# Patient Record
Sex: Male | Born: 1950 | Race: White | Hispanic: No | Marital: Married | Smoking: Former smoker
Health system: Southern US, Community
[De-identification: ages and names within clinical notes are randomized; demographics above are authoritative.]

## PROBLEM LIST (undated history)

## (undated) DIAGNOSIS — N200 Calculus of kidney: Secondary | ICD-10-CM

## (undated) DIAGNOSIS — E7800 Pure hypercholesterolemia, unspecified: Secondary | ICD-10-CM

## (undated) DIAGNOSIS — R042 Hemoptysis: Secondary | ICD-10-CM

## (undated) DIAGNOSIS — M1990 Unspecified osteoarthritis, unspecified site: Secondary | ICD-10-CM

## (undated) DIAGNOSIS — C801 Malignant (primary) neoplasm, unspecified: Secondary | ICD-10-CM

## (undated) DIAGNOSIS — D126 Benign neoplasm of colon, unspecified: Secondary | ICD-10-CM

## (undated) DIAGNOSIS — E785 Hyperlipidemia, unspecified: Secondary | ICD-10-CM

## (undated) DIAGNOSIS — G4730 Sleep apnea, unspecified: Secondary | ICD-10-CM

## (undated) HISTORY — PX: COLONOSCOPY: SHX174

---

## 2004-04-29 ENCOUNTER — Ambulatory Visit: Payer: Self-pay | Admitting: Otolaryngology

## 2004-08-31 ENCOUNTER — Ambulatory Visit: Payer: Self-pay | Admitting: Otolaryngology

## 2006-10-02 ENCOUNTER — Ambulatory Visit: Payer: Self-pay | Admitting: Gastroenterology

## 2007-06-05 ENCOUNTER — Ambulatory Visit: Payer: Self-pay | Admitting: Internal Medicine

## 2007-12-03 ENCOUNTER — Ambulatory Visit: Payer: Self-pay | Admitting: Internal Medicine

## 2008-02-15 ENCOUNTER — Emergency Department: Payer: Self-pay | Admitting: Emergency Medicine

## 2008-02-16 ENCOUNTER — Emergency Department: Payer: Self-pay | Admitting: Internal Medicine

## 2008-02-20 ENCOUNTER — Ambulatory Visit: Payer: Self-pay | Admitting: Urology

## 2008-02-29 ENCOUNTER — Ambulatory Visit: Payer: Self-pay | Admitting: Urology

## 2008-03-06 ENCOUNTER — Ambulatory Visit: Payer: Self-pay | Admitting: Urology

## 2008-03-20 ENCOUNTER — Ambulatory Visit: Payer: Self-pay | Admitting: Urology

## 2008-06-18 ENCOUNTER — Ambulatory Visit: Payer: Self-pay | Admitting: Urology

## 2008-12-31 ENCOUNTER — Ambulatory Visit: Payer: Self-pay | Admitting: Urology

## 2009-10-15 IMAGING — CR DG ABDOMEN 1V
1 series · 2 of 2 positions shown · non-contrast
Comparison: none

REASON FOR EXAM: nephrolithiasis
COMMENTS:

[Series 1: view not recorded · 0.17mm/px · 2 of 2 slices shown]
[im 1/2]
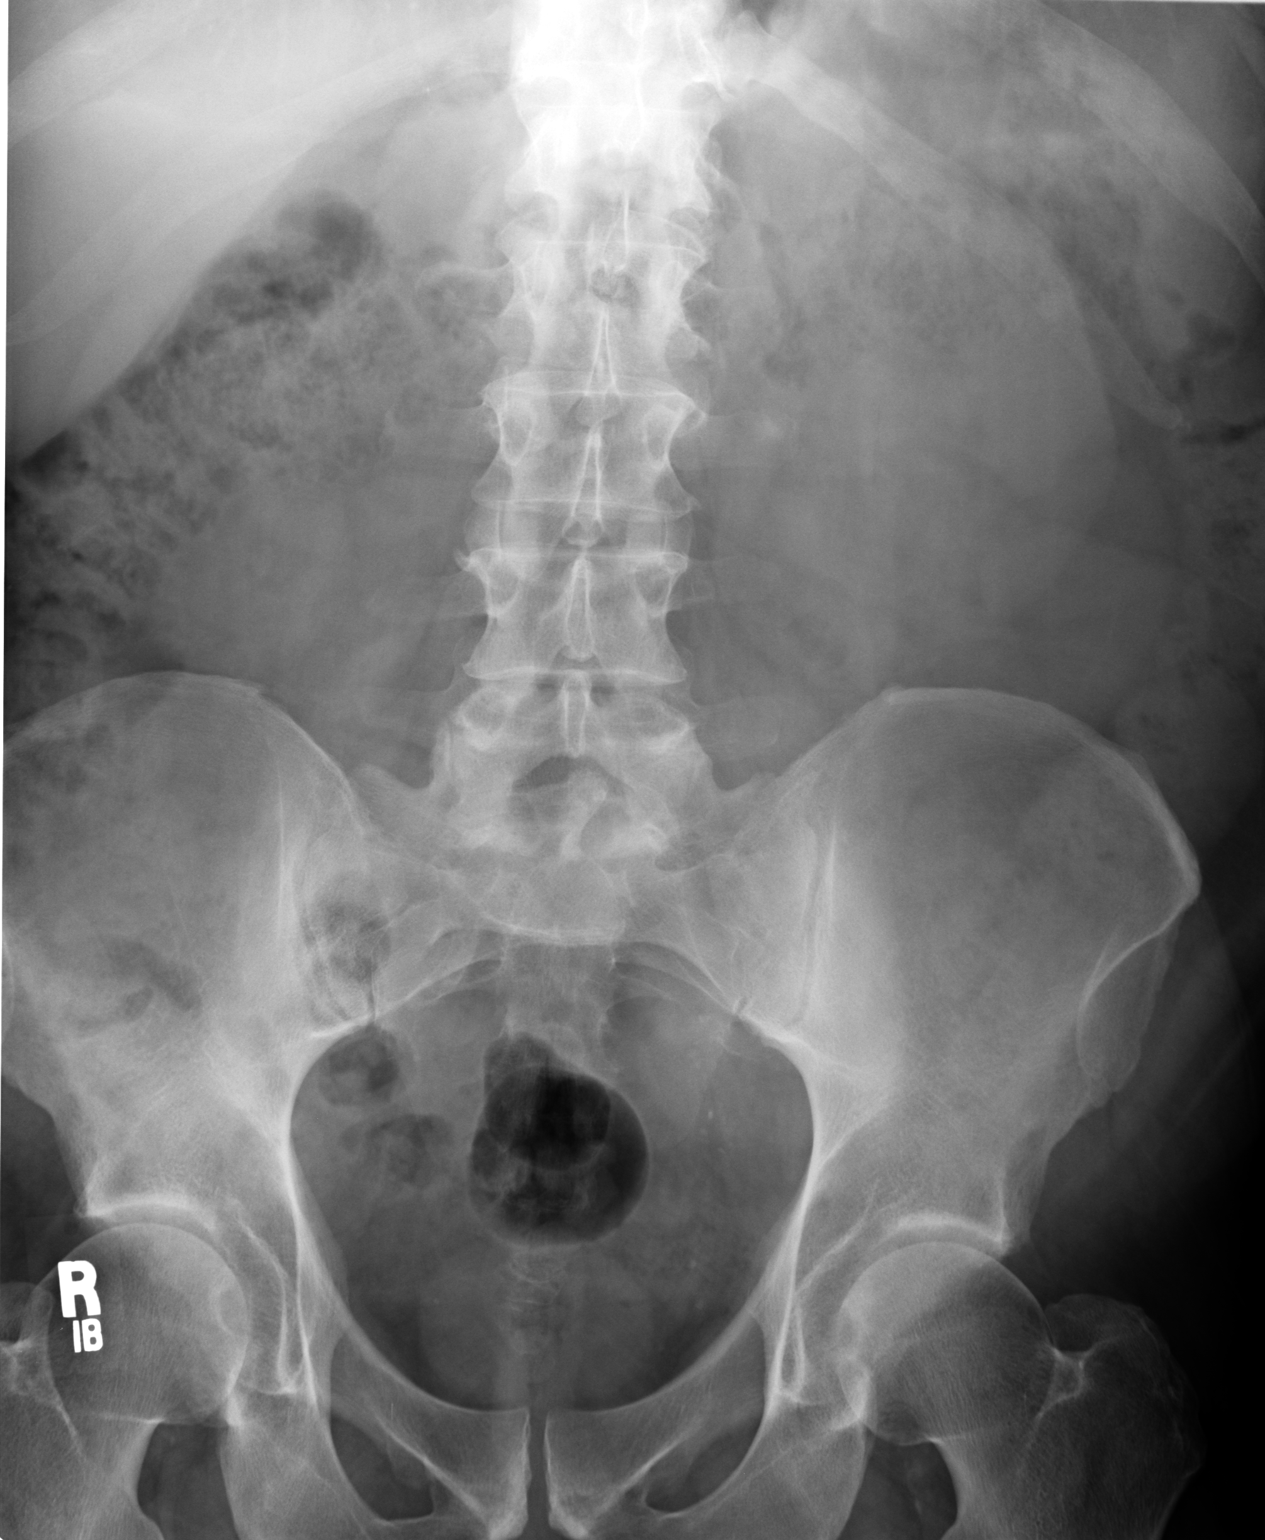
[im 2/2]
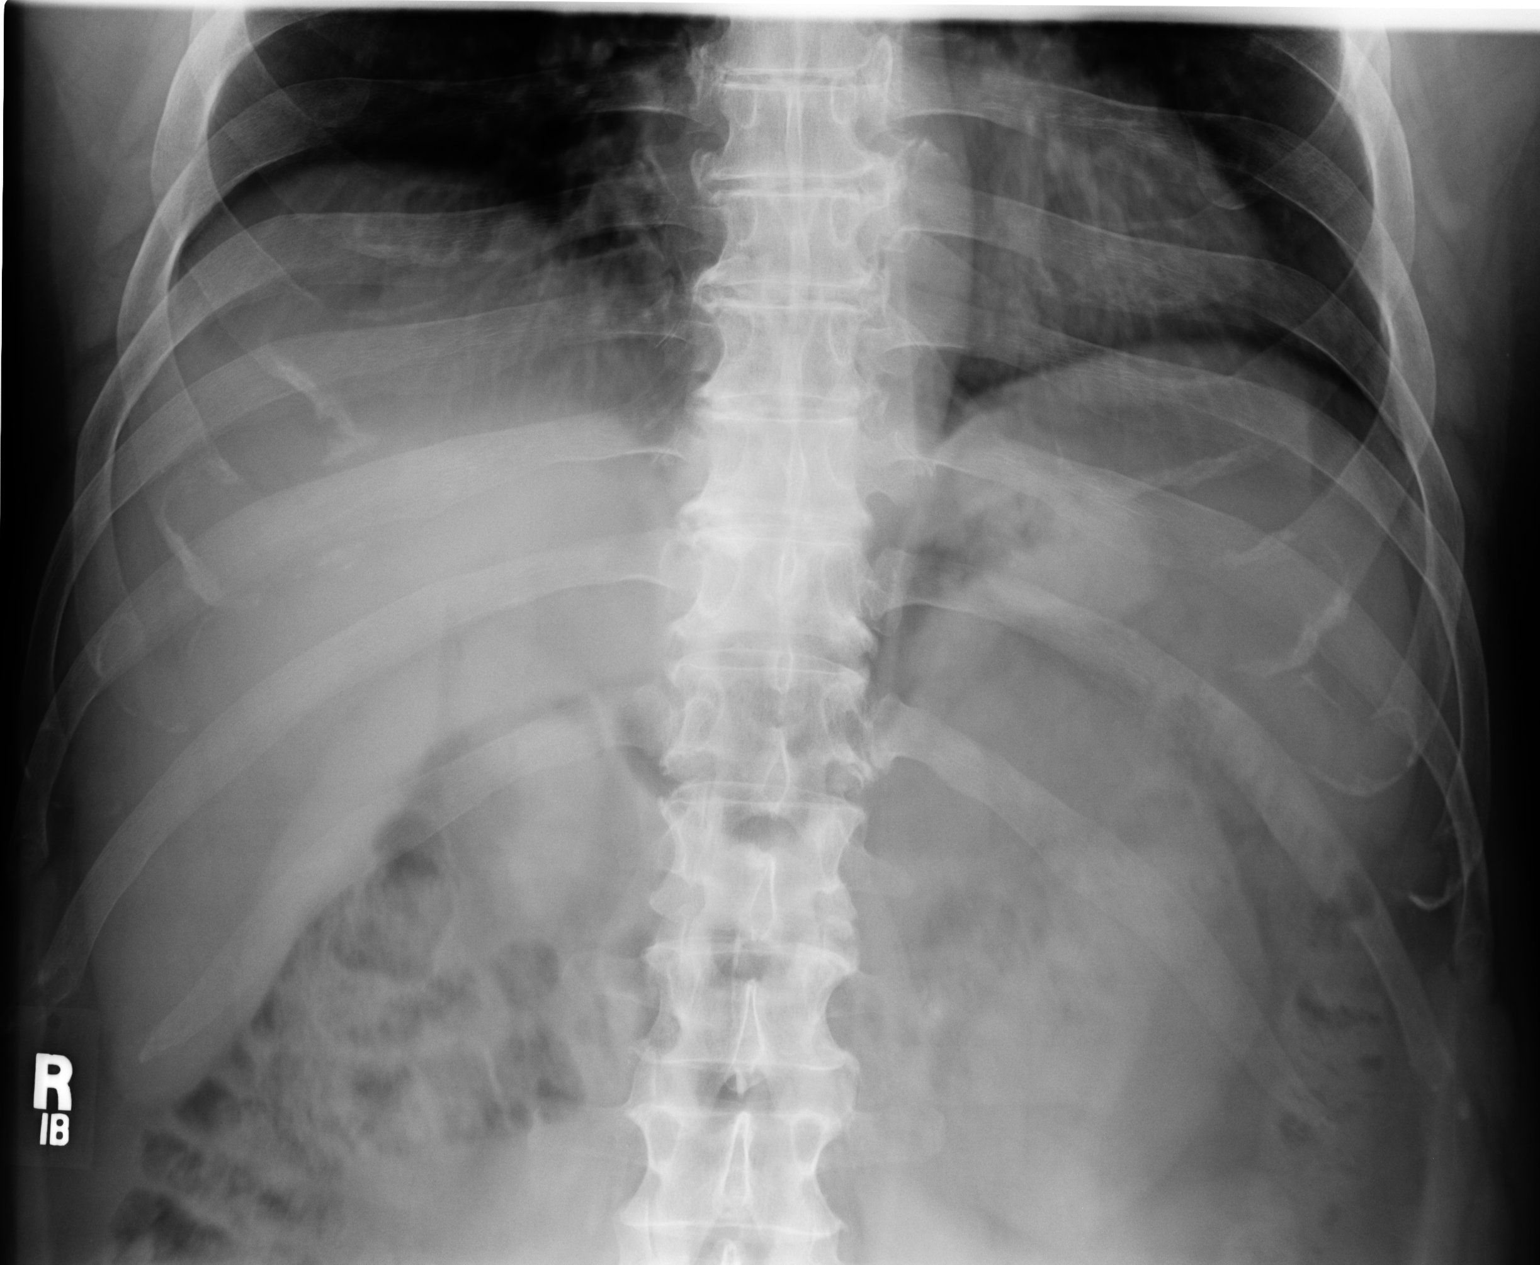

[2 of 2 positions shown; findings below may reference images not displayed]

PROCEDURE:     DXR - DXR KIDNEY URETER BLADDER  - February 20, 2008  [DATE]

RESULT:     There is a faint 7 mm density projected over the lateral aspect
of the third lumbar transverse process on the LEFT. This likely represents
the renal stone projected at the level of the LEFT renal pelvis on the prior
exam of 12/03/2007. No other densities suspicious for renal or ureteral
calcifications are identified.
IMPRESSION: There is a faint density noted along the medial aspect of the LEFT kidney
which also overlies the L3 lumbar transverse process. This likely represents
the LEFT renal stone noted at prior CT.

## 2009-10-24 IMAGING — CR DG ABDOMEN 1V
1 series · 1 of 1 positions shown · non-contrast
Comparison: none

REASON FOR EXAM: NEPHROLITHIASIS
COMMENTS:

[view not recorded]
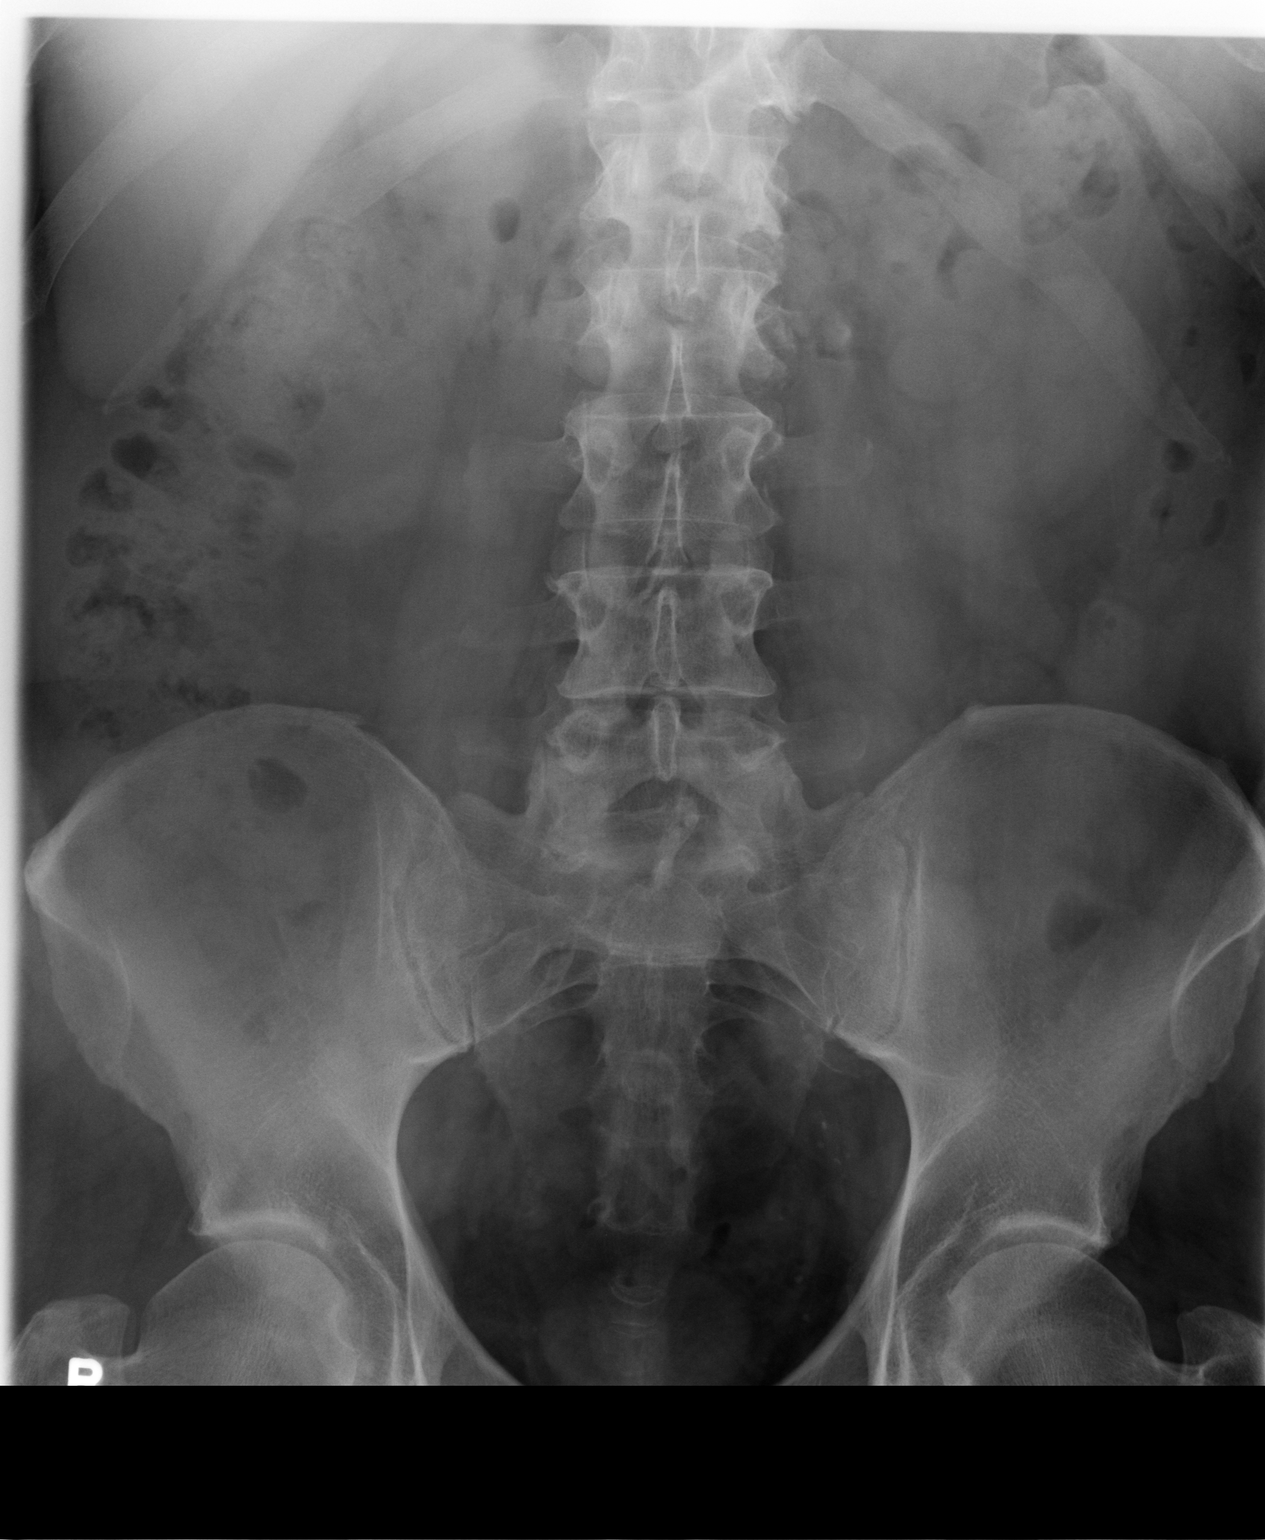

[1 of 1 positions shown; findings below may reference images not displayed]

PROCEDURE:     DXR - DXR KIDNEY URETER BLADDER  - February 29, 2008  [DATE]

RESULT:     Comparison is made to a prior exam of 02/20/2008.

There is a nonspecific, ill-defined density projected over the L2 lumbar
transverse process which could possibly represent a stone in the proximal
LEFT ureter that is visualized higher in position than on the prior exam due
to differences in inspiration and projection. The finding; however, is not
definite on plain film examination. No other densities suspicious for renal
or ureteral stones are identified.
IMPRESSION: Possible proximal LEFT ureteral stone.

## 2010-01-06 ENCOUNTER — Ambulatory Visit: Payer: Self-pay | Admitting: Urology

## 2010-01-07 ENCOUNTER — Ambulatory Visit: Payer: Self-pay | Admitting: Gastroenterology

## 2010-02-11 IMAGING — CR DG ABDOMEN 1V
1 series · 2 of 2 positions shown · non-contrast
Comparison: none

REASON FOR EXAM: NEPHROLITHIASIS
COMMENTS:

[Series 1: view not recorded · 0.17mm/px · 2 of 2 slices shown]
[im 1/2]
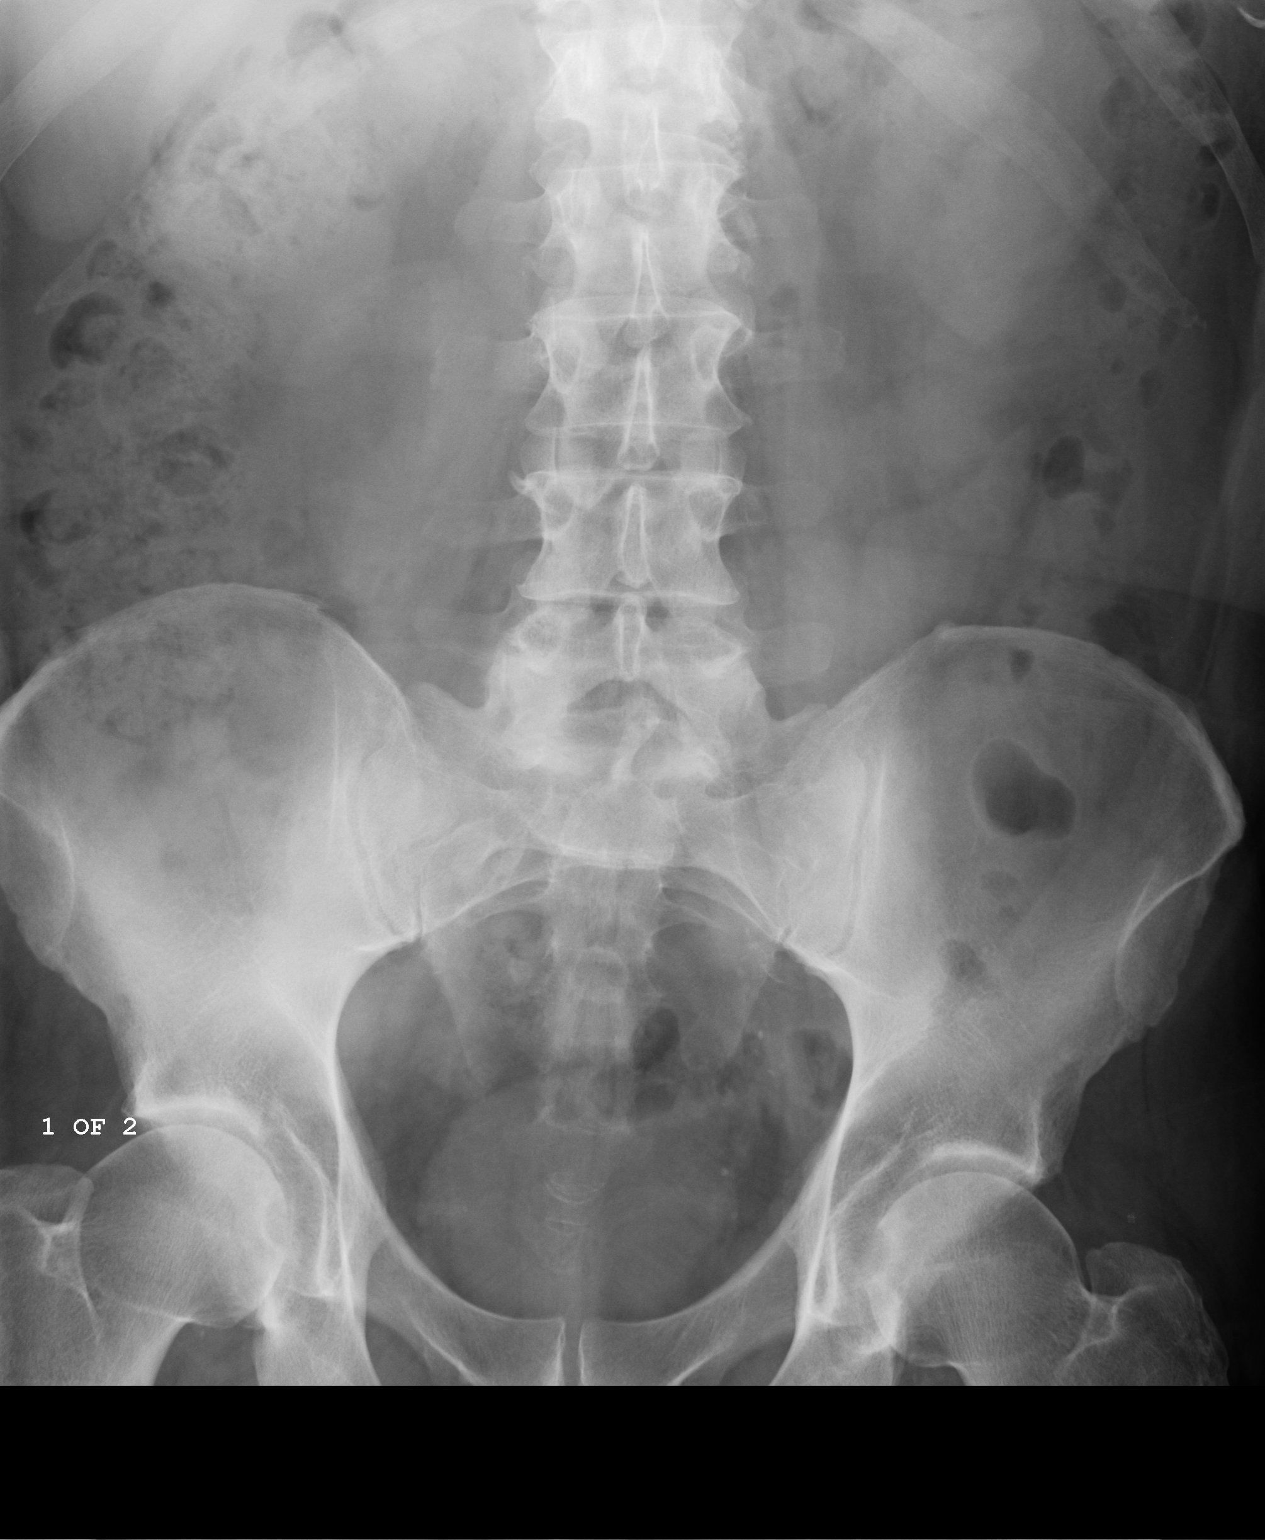
[im 2/2]
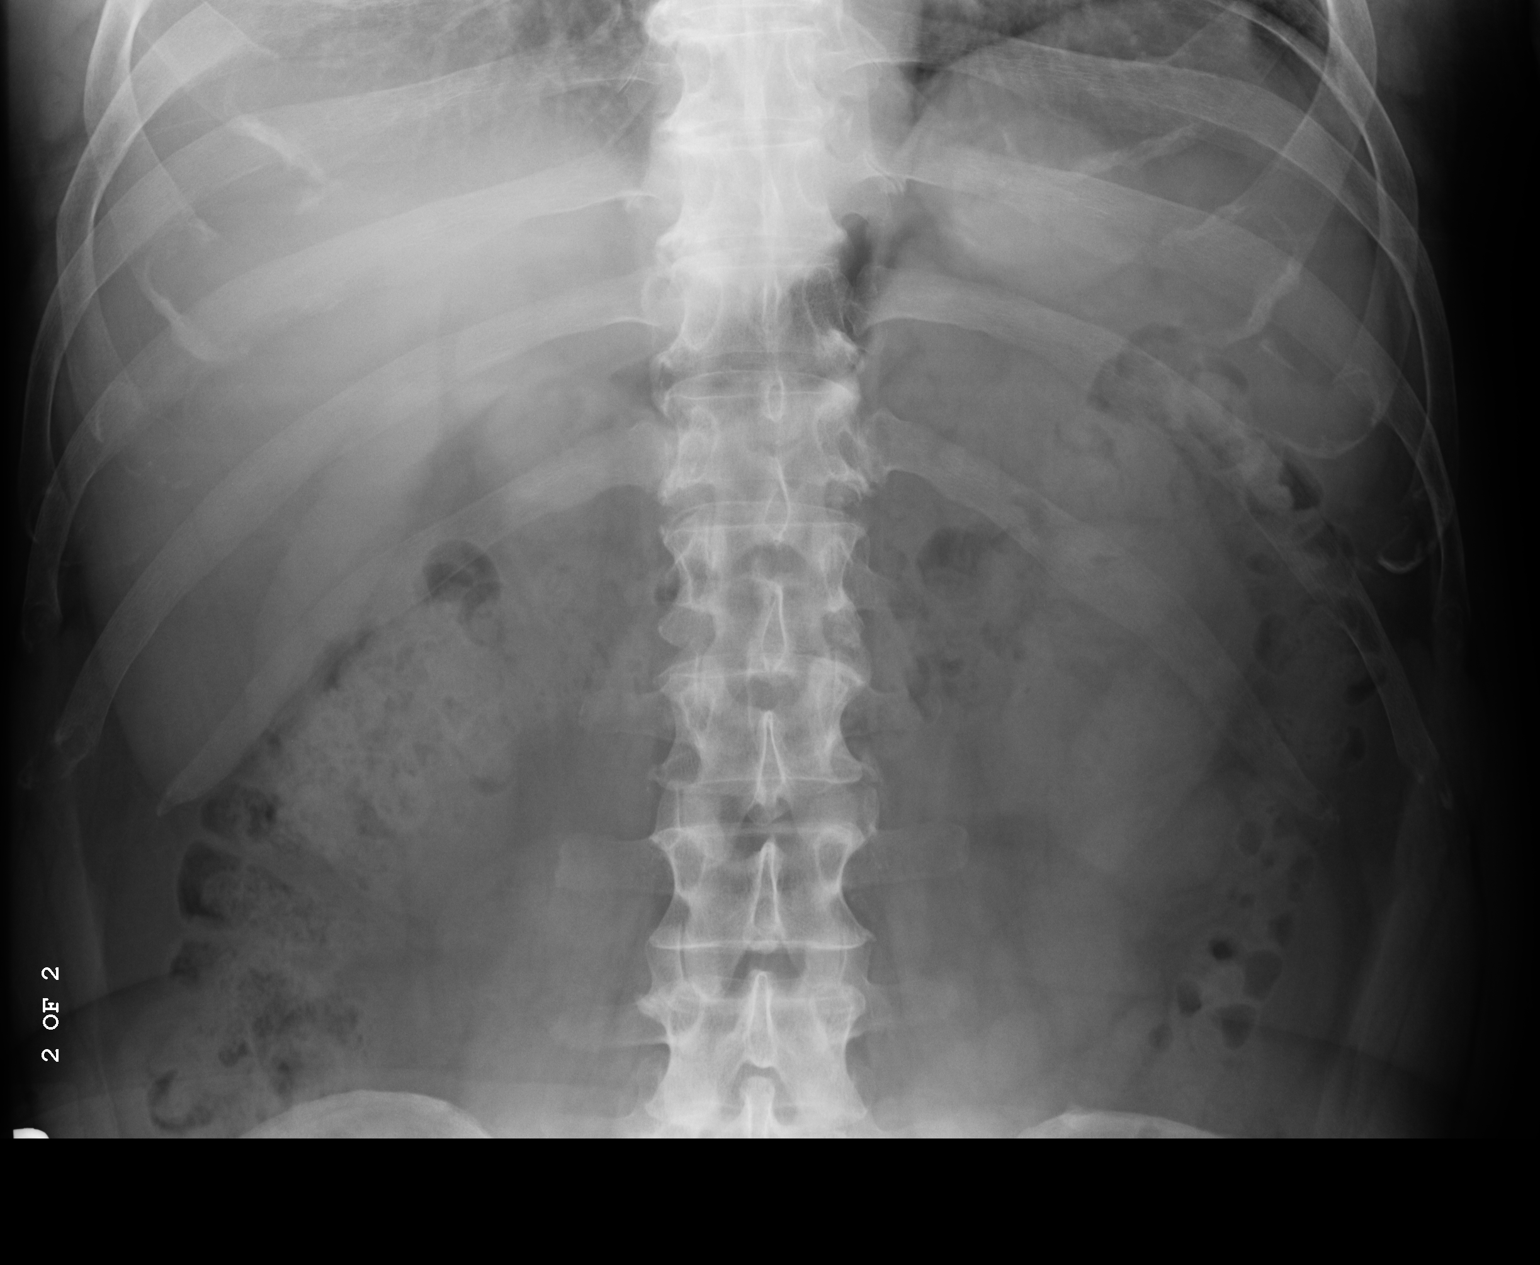

[2 of 2 positions shown; findings below may reference images not displayed]

PROCEDURE:     DXR - DXR KIDNEY URETER BLADDER  - June 18, 2008  [DATE]

RESULT:     Comparison is made to the previous image of 03/20/2008.

Densities are again noted in the left pelvic region consistent with
phleboliths. A distal ureteral stone would be difficult to completely
exclude on the left. No definite renal stones are evident. The small stone
seen previously over the left kidney is not definitely identified. This may
have passed.
IMPRESSION: No definite urinary tract stones evident. Please see above.

## 2011-01-05 ENCOUNTER — Ambulatory Visit: Payer: Self-pay | Admitting: Urology

## 2012-01-09 ENCOUNTER — Ambulatory Visit: Payer: Self-pay | Admitting: Urology

## 2012-08-30 IMAGING — CR DG ABDOMEN 1V
1 series · 1 of 1 positions shown · non-contrast
Comparison: none

REASON FOR EXAM: NEPHROLITHIASIS PT NEED FILMS
COMMENTS:

[view not recorded]
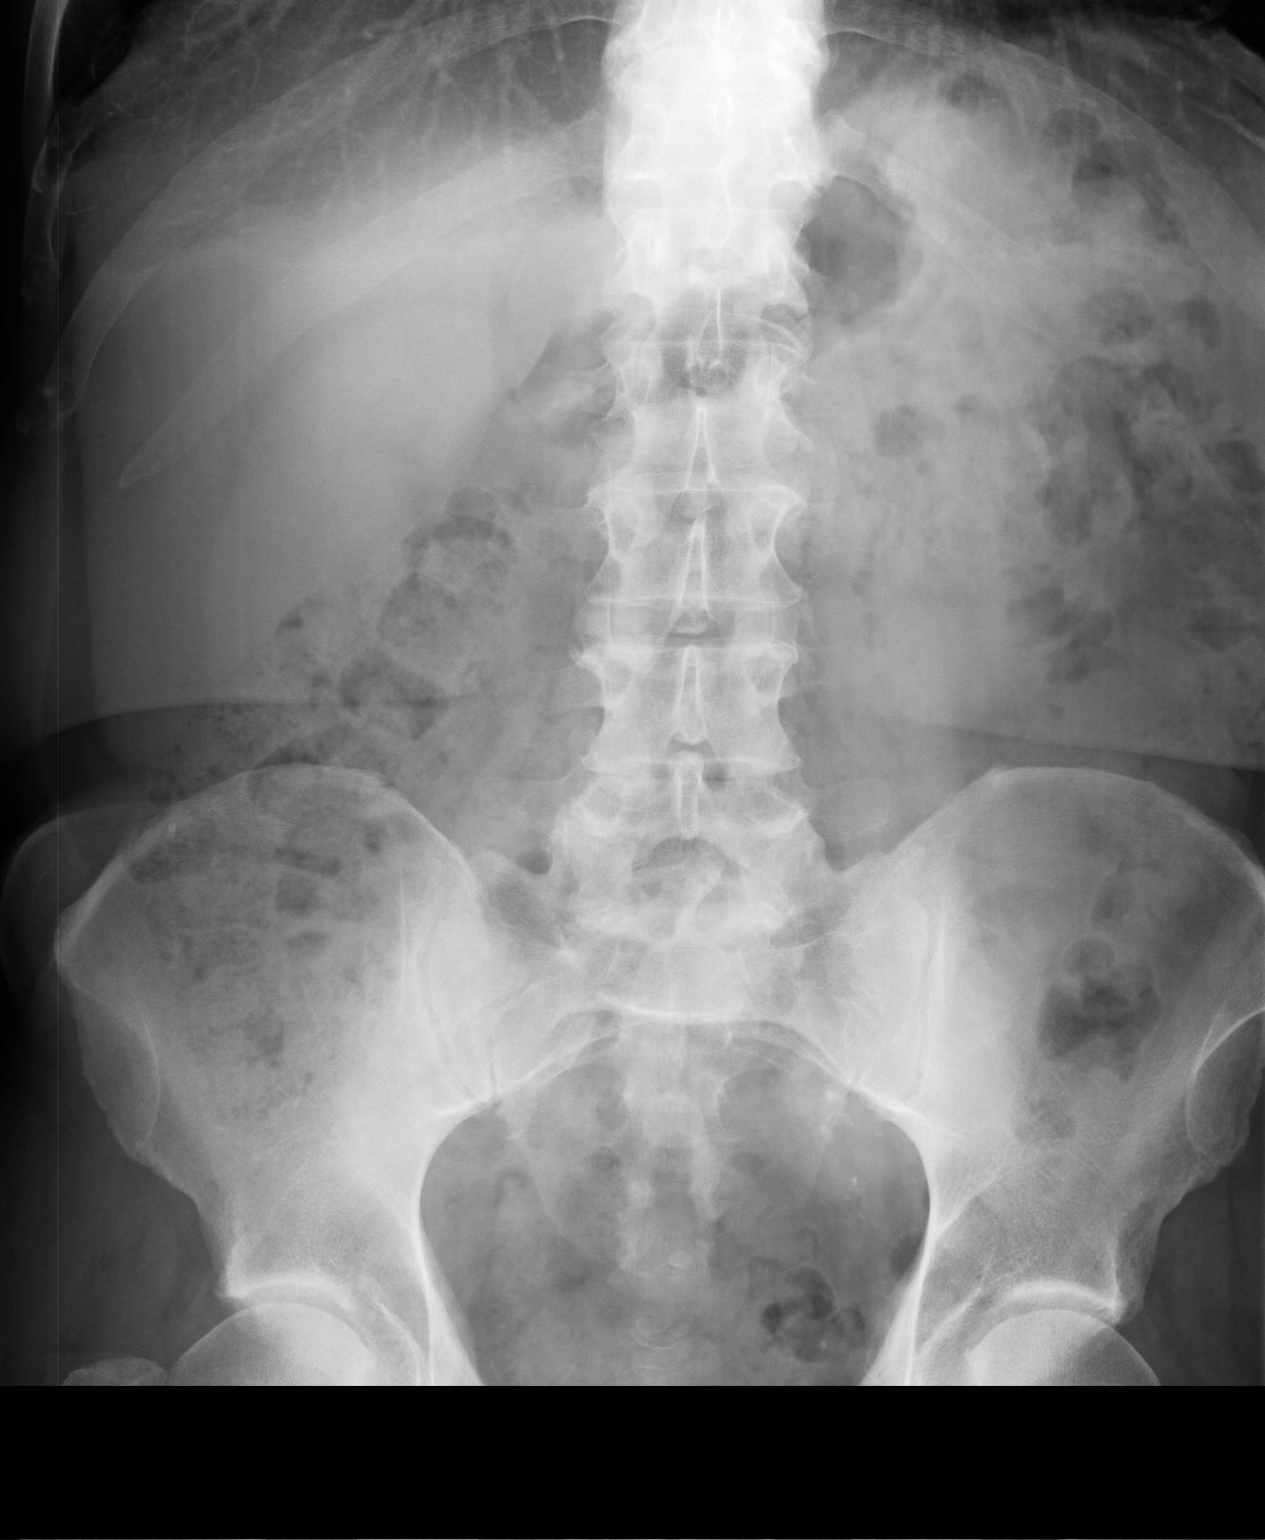

[1 of 1 positions shown; findings below may reference images not displayed]

PROCEDURE:     DXR - DXR KIDNEY URETER BLADDER  - January 05, 2011 [DATE]

RESULT:     Comparison is made to the prior exam of 01/06/2010.

No definite renal or ureteral calcifications are identified by routine
radiography. There is a large amount of fecal material in the colon. The
osseous structures are normal in appearance.
IMPRESSION: No definite renal or ureteral calcifications are identified by
routine radiography.

## 2013-01-09 ENCOUNTER — Ambulatory Visit: Payer: Self-pay | Admitting: Urology

## 2014-08-12 HISTORY — PX: PROSTATECTOMY: SHX69

## 2014-09-04 IMAGING — CR DG ABDOMEN 1V
1 series · 1 of 1 positions shown · non-contrast
Comparison: none

REASON FOR EXAM: Nephrolithiasis renal colic
COMMENTS:

[t abdomen supine]
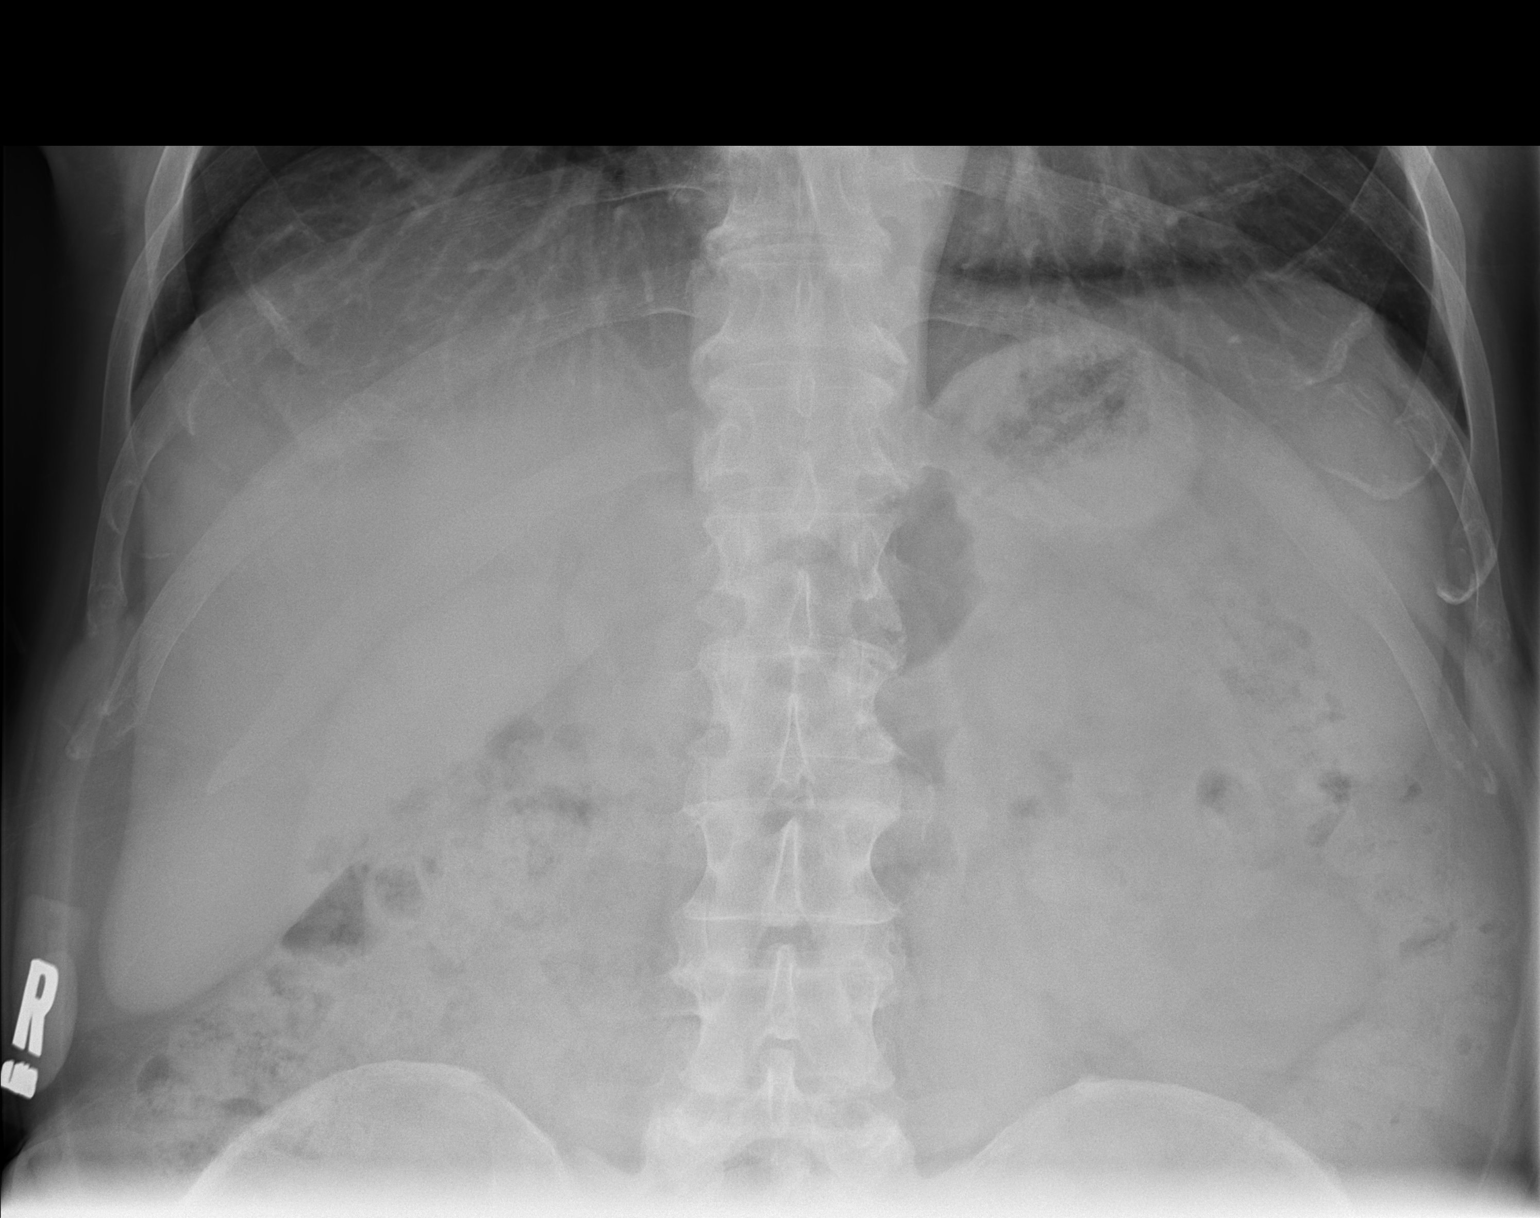

[1 of 1 positions shown; findings below may reference images not displayed]

PROCEDURE:     DXR - DXR KIDNEY URETER BLADDER  - January 09, 2013  [DATE]

RESULT:     Comparison is made to previous images of 01/09/2012. No definite
radiopaque calculi are seen. The kidneys are partially obscured by overlying
fecal material. There is calcification in the pelvis especially on the left
which could represent a distal left ureteral stone or phlebolith. This is
unchanged. Bony structures are unremarkable.
IMPRESSION: 1. No definite nephrolithiasis evident. No evidence of bowel obstruction.

[REDACTED]

## 2014-12-31 ENCOUNTER — Encounter: Payer: Self-pay | Admitting: *Deleted

## 2015-01-01 ENCOUNTER — Ambulatory Visit: Payer: BLUE CROSS/BLUE SHIELD | Admitting: Anesthesiology

## 2015-01-01 ENCOUNTER — Ambulatory Visit
Admission: RE | Admit: 2015-01-01 | Discharge: 2015-01-01 | Disposition: A | Discharge status: Home / Self Care | Payer: BLUE CROSS/BLUE SHIELD | Source: Ambulatory Visit | Attending: Gastroenterology | Admitting: Gastroenterology

## 2015-01-01 ENCOUNTER — Encounter
Admission: RE | Disposition: A | Discharge status: Home / Self Care | Payer: Self-pay | Source: Ambulatory Visit | Attending: Gastroenterology

## 2015-01-01 DIAGNOSIS — D122 Benign neoplasm of ascending colon: Secondary | ICD-10-CM | POA: Insufficient documentation

## 2015-01-01 DIAGNOSIS — Z86010 Personal history of colonic polyps: Secondary | ICD-10-CM | POA: Insufficient documentation

## 2015-01-01 DIAGNOSIS — E780 Pure hypercholesterolemia: Secondary | ICD-10-CM | POA: Insufficient documentation

## 2015-01-01 DIAGNOSIS — D125 Benign neoplasm of sigmoid colon: Secondary | ICD-10-CM | POA: Insufficient documentation

## 2015-01-01 DIAGNOSIS — G4730 Sleep apnea, unspecified: Secondary | ICD-10-CM | POA: Insufficient documentation

## 2015-01-01 DIAGNOSIS — Z87891 Personal history of nicotine dependence: Secondary | ICD-10-CM | POA: Diagnosis not present

## 2015-01-01 DIAGNOSIS — Z87442 Personal history of urinary calculi: Secondary | ICD-10-CM | POA: Insufficient documentation

## 2015-01-01 DIAGNOSIS — Z8371 Family history of colonic polyps: Secondary | ICD-10-CM | POA: Insufficient documentation

## 2015-01-01 HISTORY — PX: COLONOSCOPY WITH PROPOFOL: SHX5780

## 2015-01-01 HISTORY — DX: Pure hypercholesterolemia, unspecified: E78.00

## 2015-01-01 HISTORY — DX: Hemoptysis: R04.2

## 2015-01-01 HISTORY — DX: Sleep apnea, unspecified: G47.30

## 2015-01-01 HISTORY — DX: Calculus of kidney: N20.0

## 2015-01-01 SURGERY — COLONOSCOPY WITH PROPOFOL
Anesthesia: General

## 2015-01-01 MED ORDER — LIDOCAINE HCL (CARDIAC) 20 MG/ML IV SOLN
INTRAVENOUS | Status: DC | PRN
  Administered 2015-01-01: 30 mg via INTRAVENOUS

## 2015-01-01 MED ORDER — SODIUM CHLORIDE 0.9 % IV SOLN
INTRAVENOUS | Status: DC
  Administered 2015-01-01: 1000 mL via INTRAVENOUS

## 2015-01-01 MED ORDER — PROPOFOL INFUSION 10 MG/ML OPTIME
INTRAVENOUS | Status: DC | PRN
  Administered 2015-01-01: 160 ug/kg/min via INTRAVENOUS

## 2015-01-01 MED ORDER — FENTANYL CITRATE (PF) 100 MCG/2ML IJ SOLN
INTRAMUSCULAR | Status: DC | PRN
  Administered 2015-01-01: 50 ug via INTRAVENOUS

## 2015-01-01 MED ORDER — PROPOFOL 10 MG/ML IV BOLUS
INTRAVENOUS | Status: DC | PRN
  Administered 2015-01-01: 50 mg via INTRAVENOUS

## 2015-01-01 MED ORDER — MIDAZOLAM HCL 5 MG/5ML IJ SOLN
INTRAMUSCULAR | Status: DC | PRN
  Administered 2015-01-01: 1 mg via INTRAVENOUS

## 2015-01-01 MED ORDER — SODIUM CHLORIDE 0.9 % IV SOLN
INTRAVENOUS | Status: DC
  Administered 2015-01-01 (×2): via INTRAVENOUS

## 2015-01-01 NOTE — Op Note (Signed)
[facility] Gastroenterology Patient Name: William Larsen Procedure Date: 01/01/2015 8:35 AM MRN: [id number] Account #: [id number] Date of Birth: [birth year] Admit Type: Outpatient Age: 64 Room: [facility] ENDO ROOM 4 Gender: Male Note Status: Finalized Procedure:         Colonoscopy Indications:       Family history of colonic polyps in a first-degree                     relative, Personal history of colonic polyps Providers:         Lupita Dawn. Candace Cruise, MD Referring MD:      Ocie Cornfield. Ouida Sills, MD (Referring MD) Medicines:         Monitored Anesthesia Care Complications:     No immediate complications. Procedure:         Pre-Anesthesia Assessment:                    - Prior to the procedure, a History and Physical was                     performed, and patient medications, allergies and                     sensitivities were reviewed. The patient's tolerance of                     previous anesthesia was reviewed.                    - The risks and benefits of the procedure and the sedation                     options and risks were discussed with the patient. All                     questions were answered and informed consent was obtained.                    - After reviewing the risks and benefits, the patient was                     deemed in satisfactory condition to undergo the procedure.                    After obtaining informed consent, the colonoscope was                     passed under direct vision. Throughout the procedure, the                     patient's blood pressure, pulse, and oxygen saturations                     were monitored continuously. The Olympus PCF-160AL                     Colonoscope (S# [id number]) was introduced through the anus                     and advanced to the the cecum, identified by appendiceal                     orifice and ileocecal valve. The colonoscopy was performed  without difficulty. The patient tolerated  the procedure                     well. The quality of the bowel preparation was good. Findings:      A small polyp was found in the ascending colon. The polyp was sessile.       The polyp was removed with a cold snare. Resection and retrieval were       complete.      A diminutive polyp was found in the sigmoid colon. The polyp was       sessile. The polyp was removed with a jumbo cold forceps. Resection and       retrieval were complete.      The exam was otherwise without abnormality. Impression:        - One small polyp in the ascending colon. Resected and                     retrieved.                    - One diminutive polyp in the sigmoid colon. Resected and                     retrieved.                    - The examination was otherwise normal. Recommendation:    - Discharge patient to home.                    - Await pathology results.                    - Repeat colonoscopy in 5 years for surveillance.                    - The findings and recommendations were discussed with the                     patient. Procedure Code(s): --- Professional ---                    [phone], Colonoscopy, flexible; with removal of tumor(s),                     polyp(s), or other lesion(s) by snare technique                    [id number], 59, Colonoscopy, flexible; with biopsy, single or                     multiple Diagnosis Code(s): --- Professional ---                    D12.2, Benign neoplasm of ascending colon                    D12.5, Benign neoplasm of sigmoid colon                    Z83.71, Family history of colonic polyps                    Z86.010, Personal history of colonic polyps CPT copyright 2014 [employer]. All rights reserved. The codes documented in this report are preliminary and upon coder review may  be revised to meet current compliance requirements. Eddie Dibbles  Johnell Comings, MD 01/01/2015 9:01:44 AM This report has been signed electronically. Number of Addenda: 0 Note  Initiated On: 01/01/2015 8:35 AM Scope Withdrawal Time: 0 hours 6 minutes 27 seconds  Total Procedure Duration: 0 hours 9 minutes 4 seconds       [facility]

## 2015-01-01 NOTE — H&P (Signed)
    Primary Care Physician:  Kirk Ruths., MD Primary Gastroenterologist:  Dr. Candace Cruise  Pre-Procedure History & Physical: HPI:  William Larsen. is a 64 y.o. male is here for an colonoscopy.   Past Medical History  Diagnosis Date  . Sleep apnea   . Hypercholesterolemia   . Kidney stones   . Hemoptysis     Past Surgical History  Procedure Laterality Date  . Colonoscopy      Prior to Admission medications   Medication Sig Start Date End Date Taking? Authorizing Provider  simvastatin (ZOCOR) 40 MG tablet Take 40 mg by mouth daily.   Yes Historical Provider, MD    Allergies as of 12/09/2014  . (Not on File)    History reviewed. No pertinent family history.  History   Social History  . Marital Status: Married    Spouse Name: N/A  . Number of Children: N/A  . Years of Education: N/A   Occupational History  . Not on file.   Social History Main Topics  . Smoking status: Former Research scientist (life sciences)  . Smokeless tobacco: Not on file  . Alcohol Use: No  . Drug Use: Not on file  . Sexual Activity: Not on file   Other Topics Concern  . Not on file   Social History Narrative    Review of Systems: See HPI, otherwise negative ROS  Physical Exam: BP 121/81 mmHg  Pulse 80  Temp(Src) 97.6 F (36.4 C) (Tympanic)  Resp 18  Ht 5\' 9"  (1.753 m)  Wt 97.523 kg (215 lb)  BMI 31.74 kg/m2  SpO2 96% General:   Alert,  pleasant and cooperative in NAD Head:  Normocephalic and atraumatic. Neck:  Supple; no masses or thyromegaly. Lungs:  Clear throughout to auscultation.    Heart:  Regular rate and rhythm. Abdomen:  Soft, nontender and nondistended. Normal bowel sounds, without guarding, and without rebound.   Neurologic:  Alert and  oriented x4;  grossly normal neurologically.  Impression/Plan: William Pat. is here for an colonoscopy to be performed for personal hx and family hx of colon polyps.  Risks, benefits, limitations, and alternatives regarding colonoscopy have  been reviewed with the patient.  Questions have been answered.  All parties agreeable.   William Larsen, Lupita Dawn, MD  01/01/2015, 8:36 AM

## 2015-01-01 NOTE — Transfer of Care (Signed)
Immediate Anesthesia Transfer of Care Note  Patient: William Larsen.  Procedure(s) Performed: Procedure(s): COLONOSCOPY WITH PROPOFOL (N/A)  Patient Location: PACU and Short Stay  Anesthesia Type:General  Level of Consciousness: awake, alert , oriented and patient cooperative  Airway & Oxygen Therapy: Patient Spontanous Breathing and Patient connected to nasal cannula oxygen  Post-op Assessment: Report given to RN  Post vital signs: Reviewed and stable  Last Vitals:  Filed Vitals:   01/01/15 0905  BP:   Pulse: 73  Temp: 35.9 C  Resp: 14    Complications: No apparent anesthesia complications

## 2015-01-01 NOTE — Anesthesia Postprocedure Evaluation (Signed)
  Anesthesia Post-op Note  Patient: William Larsen.  Procedure(s) Performed: Procedure(s): COLONOSCOPY WITH PROPOFOL (N/A)  Anesthesia type:General  Patient location: PACU  Post pain: Pain level controlled  Post assessment: Post-op Vital signs reviewed, Patient's Cardiovascular Status Stable, Respiratory Function Stable, Patent Airway and No signs of Nausea or vomiting  Post vital signs: Reviewed and stable  Last Vitals:  Filed Vitals:   01/01/15 0905  BP: 93/77  Pulse: 73  Temp: 35.9 C  Resp: 14    Level of consciousness: awake, alert  and patient cooperative  Complications: No apparent anesthesia complications

## 2015-01-01 NOTE — Anesthesia Preprocedure Evaluation (Signed)
Anesthesia Evaluation  Patient identified by MRN, date of birth, ID band Patient awake    Reviewed: Allergy & Precautions, NPO status , Patient's Chart, lab work & pertinent test results  History of Anesthesia Complications Negative for: history of anesthetic complications  Airway Mallampati: III       Dental  (+) Teeth Intact, Chipped   Pulmonary sleep apnea and Continuous Positive Airway Pressure Ventilation , former smoker,    + decreased breath sounds      Cardiovascular negative cardio ROS Normal cardiovascular exam    Neuro/Psych    GI/Hepatic Neg liver ROS,   Endo/Other  negative endocrine ROS  Renal/GU Renal diseasenegative Renal ROS     Musculoskeletal negative musculoskeletal ROS (+)   Abdominal Normal abdominal exam  (+)   Peds negative pediatric ROS (+)  Hematology negative hematology ROS (+)   Anesthesia Other Findings   Reproductive/Obstetrics                             Anesthesia Physical Anesthesia Plan  ASA: III  Anesthesia Plan: General   Post-op Pain Management:    Induction:   Airway Management Planned: Nasal Cannula  Additional Equipment:   Intra-op Plan:   Post-operative Plan:   Informed Consent: I have reviewed the patients History and Physical, chart, labs and discussed the procedure including the risks, benefits and alternatives for the proposed anesthesia with the patient or authorized representative who has indicated his/her understanding and acceptance.     Plan Discussed with: CRNA  Anesthesia Plan Comments:         Anesthesia Quick Evaluation

## 2015-01-02 LAB — SURGICAL PATHOLOGY

## 2015-01-05 ENCOUNTER — Encounter: Payer: Self-pay | Admitting: Gastroenterology

## 2017-07-20 ENCOUNTER — Other Ambulatory Visit: Payer: Self-pay | Admitting: Orthopedic Surgery

## 2017-07-20 DIAGNOSIS — M2391 Unspecified internal derangement of right knee: Secondary | ICD-10-CM

## 2019-07-14 ENCOUNTER — Ambulatory Visit: Payer: BLUE CROSS/BLUE SHIELD

## 2019-07-19 ENCOUNTER — Ambulatory Visit: Payer: BLUE CROSS/BLUE SHIELD

## 2019-07-22 ENCOUNTER — Ambulatory Visit: Payer: Medicare Other

## 2020-03-17 ENCOUNTER — Encounter: Payer: Self-pay | Admitting: Unknown Physician Specialty

## 2020-03-17 ENCOUNTER — Other Ambulatory Visit: Payer: Self-pay

## 2020-03-18 ENCOUNTER — Other Ambulatory Visit
Admission: RE | Admit: 2020-03-18 | Discharge: 2020-03-18 | Disposition: A | Discharge status: Home / Self Care | Payer: Medicare Other | Source: Ambulatory Visit | Attending: Unknown Physician Specialty | Admitting: Unknown Physician Specialty

## 2020-03-18 DIAGNOSIS — Z20822 Contact with and (suspected) exposure to covid-19: Secondary | ICD-10-CM | POA: Insufficient documentation

## 2020-03-18 DIAGNOSIS — Z01812 Encounter for preprocedural laboratory examination: Secondary | ICD-10-CM | POA: Insufficient documentation

## 2020-03-18 LAB — SARS CORONAVIRUS 2 (TAT 6-24 HRS): SARS Coronavirus 2: NEGATIVE

## 2020-03-20 ENCOUNTER — Ambulatory Visit: Payer: Medicare Other | Admitting: Anesthesiology

## 2020-03-20 ENCOUNTER — Encounter
Admission: RE | Disposition: A | Discharge status: Home / Self Care | Payer: Self-pay | Source: Home / Self Care | Attending: Unknown Physician Specialty

## 2020-03-20 ENCOUNTER — Ambulatory Visit
Admission: RE | Admit: 2020-03-20 | Discharge: 2020-03-20 | Disposition: A | Discharge status: Home / Self Care | Payer: Medicare Other | Attending: Unknown Physician Specialty | Admitting: Unknown Physician Specialty

## 2020-03-20 ENCOUNTER — Other Ambulatory Visit: Payer: Self-pay

## 2020-03-20 ENCOUNTER — Encounter: Payer: Self-pay | Admitting: Unknown Physician Specialty

## 2020-03-20 DIAGNOSIS — Z6835 Body mass index (BMI) 35.0-35.9, adult: Secondary | ICD-10-CM | POA: Diagnosis not present

## 2020-03-20 DIAGNOSIS — E669 Obesity, unspecified: Secondary | ICD-10-CM | POA: Diagnosis not present

## 2020-03-20 DIAGNOSIS — Z8546 Personal history of malignant neoplasm of prostate: Secondary | ICD-10-CM | POA: Diagnosis not present

## 2020-03-20 DIAGNOSIS — R490 Dysphonia: Secondary | ICD-10-CM | POA: Diagnosis not present

## 2020-03-20 DIAGNOSIS — Z809 Family history of malignant neoplasm, unspecified: Secondary | ICD-10-CM | POA: Insufficient documentation

## 2020-03-20 DIAGNOSIS — C320 Malignant neoplasm of glottis: Secondary | ICD-10-CM | POA: Insufficient documentation

## 2020-03-20 DIAGNOSIS — Z86010 Personal history of colonic polyps: Secondary | ICD-10-CM | POA: Diagnosis not present

## 2020-03-20 DIAGNOSIS — Z87891 Personal history of nicotine dependence: Secondary | ICD-10-CM | POA: Diagnosis not present

## 2020-03-20 DIAGNOSIS — Z7982 Long term (current) use of aspirin: Secondary | ICD-10-CM | POA: Insufficient documentation

## 2020-03-20 DIAGNOSIS — D491 Neoplasm of unspecified behavior of respiratory system: Secondary | ICD-10-CM | POA: Diagnosis present

## 2020-03-20 DIAGNOSIS — Z79899 Other long term (current) drug therapy: Secondary | ICD-10-CM | POA: Insufficient documentation

## 2020-03-20 DIAGNOSIS — G4730 Sleep apnea, unspecified: Secondary | ICD-10-CM | POA: Insufficient documentation

## 2020-03-20 HISTORY — PX: MICROLARYNGOSCOPY: SHX5208

## 2020-03-20 SURGERY — MICROLARYNGOSCOPY
Anesthesia: General

## 2020-03-20 MED ORDER — ROCURONIUM BROMIDE 100 MG/10ML IV SOLN
INTRAVENOUS | Status: DC | PRN
  Administered 2020-03-20: 15 mg via INTRAVENOUS

## 2020-03-20 MED ORDER — ONDANSETRON HCL 4 MG/2ML IJ SOLN
INTRAMUSCULAR | Status: DC | PRN
  Administered 2020-03-20: 4 mg via INTRAVENOUS

## 2020-03-20 MED ORDER — PROPOFOL 10 MG/ML IV BOLUS
INTRAVENOUS | Status: DC | PRN
  Administered 2020-03-20: 160 mg via INTRAVENOUS
  Administered 2020-03-20: 40 mg via INTRAVENOUS

## 2020-03-20 MED ORDER — SUCCINYLCHOLINE CHLORIDE 20 MG/ML IJ SOLN
INTRAMUSCULAR | Status: DC | PRN
  Administered 2020-03-20: 100 mg via INTRAVENOUS

## 2020-03-20 MED ORDER — LIDOCAINE HCL (CARDIAC) PF 100 MG/5ML IV SOSY
PREFILLED_SYRINGE | INTRAVENOUS | Status: DC | PRN
  Administered 2020-03-20: 30 mg via INTRAVENOUS

## 2020-03-20 MED ORDER — GLYCOPYRROLATE 0.2 MG/ML IJ SOLN
INTRAMUSCULAR | Status: DC | PRN
  Administered 2020-03-20: .1 mg via INTRAVENOUS

## 2020-03-20 MED ORDER — MIDAZOLAM HCL 5 MG/5ML IJ SOLN
INTRAMUSCULAR | Status: DC | PRN
  Administered 2020-03-20: 2 mg via INTRAVENOUS

## 2020-03-20 MED ORDER — LACTATED RINGERS IV SOLN: INTRAVENOUS | Status: DC

## 2020-03-20 MED ORDER — DEXAMETHASONE SODIUM PHOSPHATE 4 MG/ML IJ SOLN
INTRAMUSCULAR | Status: DC | PRN
  Administered 2020-03-20: 4 mg via INTRAVENOUS

## 2020-03-20 MED ORDER — SUGAMMADEX SODIUM 200 MG/2ML IV SOLN
INTRAVENOUS | Status: DC | PRN
  Administered 2020-03-20: 200 mg via INTRAVENOUS

## 2020-03-20 MED ORDER — FENTANYL CITRATE (PF) 100 MCG/2ML IJ SOLN
INTRAMUSCULAR | Status: DC | PRN
Start: 2020-03-20 — End: 2020-03-20
  Administered 2020-03-20 (×2): 50 ug via INTRAVENOUS

## 2020-03-20 SURGICAL SUPPLY — 24 items
BASIN GRAD PLASTIC 32OZ STRL (MISCELLANEOUS) ×3 IMPLANT
BLOCK BITE GUARD (MISCELLANEOUS) ×3 IMPLANT
CONT SPEC 4OZ CLIKSEAL STRL BL (MISCELLANEOUS) ×3 IMPLANT
COVER MAYO STAND STRL (DRAPES) ×3 IMPLANT
COVER TABLE BACK 60X90 (DRAPES) ×3 IMPLANT
CUP MEDICINE 2OZ PLAST GRAD ST (MISCELLANEOUS) ×3 IMPLANT
DRAPE SHEET LG 3/4 BI-LAMINATE (DRAPES) ×3 IMPLANT
DRSG TELFA 4X3 1S NADH ST (GAUZE/BANDAGES/DRESSINGS) ×3 IMPLANT
GLOVE BIO SURGEON STRL SZ7.5 (GLOVE) ×3 IMPLANT
KIT TURNOVER KIT A (KITS) ×3 IMPLANT
MARKER SKIN DUAL TIP RULER LAB (MISCELLANEOUS) ×3 IMPLANT
NDL FILTER BLUNT 18X1 1/2 (NEEDLE) ×1 IMPLANT
NEEDLE FILTER BLUNT 18X 1/2SAF (NEEDLE) ×2
NEEDLE FILTER BLUNT 18X1 1/2 (NEEDLE) ×1 IMPLANT
NS IRRIG 500ML POUR BTL (IV SOLUTION) ×3 IMPLANT
PATTIES SURGICAL .5 X.5 (GAUZE/BANDAGES/DRESSINGS) ×3 IMPLANT
SOL ANTI-FOG 6CC FOG-OUT (MISCELLANEOUS) ×1 IMPLANT
SOL FOG-OUT ANTI-FOG 6CC (MISCELLANEOUS) ×2
SPONGE XRAY 4X4 16PLY STRL (MISCELLANEOUS) ×3 IMPLANT
STRAP BODY AND KNEE 60X3 (MISCELLANEOUS) ×3 IMPLANT
SYR 10ML LL (SYRINGE) ×3 IMPLANT
TOWEL OR 17X26 4PK STRL BLUE (TOWEL DISPOSABLE) ×3 IMPLANT
TUBING CONN 6MMX3.1M (TUBING) ×2
TUBING SUCTION CONN 0.25 STRL (TUBING) ×1 IMPLANT

## 2020-03-20 NOTE — Transfer of Care (Signed)
Immediate Anesthesia Transfer of Care Note  Patient: William Larsen.  Procedure(s) Performed: MICROLARYNGOSCOPY cwith excision of left vocal cord lesion (N/A )  Patient Location: PACU  Anesthesia Type: General  Level of Consciousness: awake, alert  and patient cooperative  Airway and Oxygen Therapy: Patient Spontanous Breathing and Patient connected to supplemental oxygen  Post-op Assessment: Post-op Vital signs reviewed, Patient's Cardiovascular Status Stable, Respiratory Function Stable, Patent Airway and No signs of Nausea or vomiting  Post-op Vital Signs: Reviewed and stable  Complications: No complications documented.

## 2020-03-20 NOTE — Anesthesia Preprocedure Evaluation (Signed)
Anesthesia Evaluation  Patient identified by MRN, date of birth, ID band Patient awake    History of Anesthesia Complications Negative for: history of anesthetic complications  Airway Mallampati: II  TM Distance: >3 FB Neck ROM: Full    Dental no notable dental hx.    Pulmonary sleep apnea and Continuous Positive Airway Pressure Ventilation , former smoker,    Pulmonary exam normal        Cardiovascular Exercise Tolerance: Good Normal cardiovascular exam     Neuro/Psych negative neurological ROS     GI/Hepatic   Endo/Other  Obesity BMI 35  Renal/GU      Musculoskeletal   Abdominal   Peds  Hematology   Anesthesia Other Findings   Reproductive/Obstetrics                             Anesthesia Physical Anesthesia Plan  ASA: II  Anesthesia Plan: General   Post-op Pain Management:    Induction: Intravenous  PONV Risk Score and Plan: 2 and Ondansetron, Dexamethasone, Treatment may vary due to age or medical condition and Midazolam  Airway Management Planned: Oral ETT  Additional Equipment: None  Intra-op Plan:   Post-operative Plan: Extubation in OR  Informed Consent: I have reviewed the patients History and Physical, chart, labs and discussed the procedure including the risks, benefits and alternatives for the proposed anesthesia with the patient or authorized representative who has indicated his/her understanding and acceptance.       Plan Discussed with: CRNA  Anesthesia Plan Comments:         Anesthesia Quick Evaluation

## 2020-03-20 NOTE — Anesthesia Postprocedure Evaluation (Signed)
Anesthesia Post Note  Patient: William Larsen.  Procedure(s) Performed: MICROLARYNGOSCOPY cwith excision of left vocal cord lesion (N/A )     Patient location during evaluation: PACU Anesthesia Type: General Level of consciousness: awake and alert Pain management: pain level controlled Vital Signs Assessment: post-procedure vital signs reviewed and stable Respiratory status: spontaneous breathing, nonlabored ventilation, respiratory function stable and patient connected to nasal cannula oxygen Cardiovascular status: blood pressure returned to baseline and stable Postop Assessment: no apparent nausea or vomiting Anesthetic complications: no   No complications documented.  Adele Barthel [name]

## 2020-03-20 NOTE — Op Note (Signed)
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  After general trach anesthesia the table was turned 90 degrees.  Patient was draped in usual fashion for ENT surgery.  Tooth guard was placed to prevent damage to the teeth.  A Dedo laryngoscope was introduced into the airway and suspended.  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  Photodocumentation was performed using a straight Stortz rod.  Cottonoid pledget with phenylephrine lidocaine was used to bathe the larynx at approximately 3 minutes this was removed.  The operating microscope was brought on the field.  Using the [facility] microlaryngeal instruments a cup forceps was used to grasp the papillomatous mass and a short 45 degree sharp scissors were used to excise it from the underlying vocal fold.  There is one small fragment on the superior portion which was also removed using the cup forcep.  This gave excellent removal of the mass again the larynx was bathed with phenylephrine lidocaine solution.  The documentation was performed following excision.  The patient was then returned to anesthesia where he was awakened in the operating room taken care room in stable condition.  Specimen: Left vocal cord mass   Dispo:   Good  Plan: Discharged home voice rest follow-up 3 weeks  Roena Malady  03/20/2020

## 2020-03-20 NOTE — Anesthesia Procedure Notes (Signed)
Procedure Name: Intubation Date/Time: 03/20/2020 8:41 AM Performed by: Cameron Ali, CRNA Pre-anesthesia Checklist: Patient identified, Emergency Drugs available, Suction available, Patient being monitored and Timeout performed Patient Re-evaluated:Patient Re-evaluated prior to induction Oxygen Delivery Method: Circle system utilized Preoxygenation: Pre-oxygenation with 100% oxygen Induction Type: IV induction Ventilation: Mask ventilation without difficulty Laryngoscope Size: Glidescope and 4 Grade View: Grade I Tube type: MLT Tube size: 6.0 mm Number of attempts: 1 Placement Confirmation: ETT inserted through vocal cords under direct vision,  positive ETCO2 and breath sounds checked- equal and bilateral Tube secured with: Tape Dental Injury: Teeth and Oropharynx as per pre-operative assessment

## 2020-03-20 NOTE — H&P (Signed)
The patient's history has been reviewed, patient examined, no change in status, stable for surgery.  Questions were answered to the patients satisfaction.  

## 2020-03-20 NOTE — Discharge Instructions (Signed)
Laryngoscopy, Care After This sheet gives you information about how to care for yourself after your procedure. Your health care provider may also give you more specific instructions. If you have problems or questions, contact your health care provider. What can I expect after the procedure? After the procedure, it is common to have:  A sore throat.  A hoarse voice.  A temporary change in how your voice sounds. Follow these instructions at home: Medicines  Take over-the-counter and prescription medicines only as told by your health care provider. Driving   Do not drive for 24 hours if you were given a sedative during your procedure. General instructions   Return to your normal activities as told by your health care provider. Ask your health care provider what activities are safe for you. ? If your laryngoscopy was done using medicine to numb only your throat (local anesthetic), you may be able to return to your normal activities right away.  If your health care provider took tissue from your throat for lab testing (biopsy), it is up to you to get your test results. Ask your health care provider, or the department that did the procedure, when your results will be ready.  Do not use any products that contain nicotine or tobacco, such as cigarettes and e-cigarettes. If you need help quitting, ask your health care provider.  Follow instructions from your health care provider about eating or drinking restrictions.  Keep all follow-up visits as told by your health care provider. This is important. Contact a health care provider if:  You have a fever.  You develop a cough.  You develop nausea and vomiting. Get help right away if:  You have severe pain.  You have chest pain.  You have new bleeding during coughing, spitting, or vomiting.  You develop new problems with swallowing.  You have difficulty breathing. Summary  After a laryngoscopy it is common to have a sore throat,  a hoarse voice, or a temporary change in the sound of your voice.  Take over the counter and prescription medicines as told by your health care provider.  Get help right away if you have difficulty breathing after this procedure.  Keep all follow-up visits as told by your health care provider. This is important. This information is not intended to replace advice given to you by your health care provider. Make sure you discuss any questions you have with your health care provider. Document Revised: 09/27/2017 Document Reviewed: 09/27/2017 Elsevier Patient Education  [place] Anesthesia, Adult, Care After This sheet gives you information about how to care for yourself after your procedure. Your health care provider may also give you more specific instructions. If you have problems or questions, contact your health care provider. What can I expect after the procedure? After the procedure, the following side effects are common:  Pain or discomfort at the IV site.  Nausea.  Vomiting.  Sore throat.  Trouble concentrating.  Feeling cold or chills.  Weak or tired.  Sleepiness and fatigue.  Soreness and body aches. These side effects can affect parts of the body that were not involved in surgery. Follow these instructions at home:  For at least 24 hours after the procedure:  Have a responsible adult stay with you. It is important to have someone help care for you until you are awake and alert.  Rest as needed.  Do not: ? Participate in activities in which you could fall or become injured. ? Drive. ?  Use heavy machinery. ? Drink alcohol. ? Take sleeping pills or medicines that cause drowsiness. ? Make important decisions or sign legal documents. ? Take care of children on your own. Eating and drinking  Follow any instructions from your health care provider about eating or drinking restrictions.  When you feel hungry, start by eating small amounts of  foods that are soft and easy to digest (bland), such as toast. Gradually return to your regular diet.  Drink enough fluid to keep your urine pale yellow.  If you vomit, rehydrate by drinking water, juice, or clear broth. General instructions  If you have sleep apnea, surgery and certain medicines can increase your risk for breathing problems. Follow instructions from your health care provider about wearing your sleep device: ? Anytime you are sleeping, including during daytime naps. ? While taking prescription pain medicines, sleeping medicines, or medicines that make you drowsy.  Return to your normal activities as told by your health care provider. Ask your health care provider what activities are safe for you.  Take over-the-counter and prescription medicines only as told by your health care provider.  If you smoke, do not smoke without supervision.  Keep all follow-up visits as told by your health care provider. This is important. Contact a health care provider if:  You have nausea or vomiting that does not get better with medicine.  You cannot eat or drink without vomiting.  You have pain that does not get better with medicine.  You are unable to pass urine.  You develop a skin rash.  You have a fever.  You have redness around your IV site that gets worse. Get help right away if:  You have difficulty breathing.  You have chest pain.  You have blood in your urine or stool, or you vomit blood. Summary  After the procedure, it is common to have a sore throat or nausea. It is also common to feel tired.  Have a responsible adult stay with you for the first 24 hours after general anesthesia. It is important to have someone help care for you until you are awake and alert.  When you feel hungry, start by eating small amounts of foods that are soft and easy to digest (bland), such as toast. Gradually return to your regular diet.  Drink enough fluid to keep your urine pale  yellow.  Return to your normal activities as told by your health care provider. Ask your health care provider what activities are safe for you. This information is not intended to replace advice given to you by your health care provider. Make sure you discuss any questions you have with your health care provider. Document Revised: 06/02/2017 Document Reviewed: 01/13/2017 Elsevier Patient Education  [place].

## 2020-03-23 ENCOUNTER — Encounter: Payer: Self-pay | Admitting: Unknown Physician Specialty

## 2020-03-23 LAB — SURGICAL PATHOLOGY

## 2020-07-03 ENCOUNTER — Other Ambulatory Visit: Payer: Self-pay

## 2020-07-03 ENCOUNTER — Other Ambulatory Visit
Admission: RE | Admit: 2020-07-03 | Discharge: 2020-07-03 | Disposition: A | Discharge status: Home / Self Care | Payer: Medicare Other | Source: Ambulatory Visit | Attending: Gastroenterology | Admitting: Gastroenterology

## 2020-07-03 DIAGNOSIS — Z20822 Contact with and (suspected) exposure to covid-19: Secondary | ICD-10-CM | POA: Insufficient documentation

## 2020-07-03 DIAGNOSIS — Z01812 Encounter for preprocedural laboratory examination: Secondary | ICD-10-CM | POA: Insufficient documentation

## 2020-07-03 LAB — SARS CORONAVIRUS 2 (TAT 6-24 HRS): SARS Coronavirus 2: NEGATIVE

## 2020-07-06 ENCOUNTER — Encounter: Payer: Self-pay | Admitting: *Deleted

## 2020-07-07 ENCOUNTER — Other Ambulatory Visit: Payer: Self-pay

## 2020-07-07 ENCOUNTER — Encounter
Admission: RE | Disposition: A | Discharge status: Home / Self Care | Payer: Self-pay | Source: Home / Self Care | Attending: Gastroenterology

## 2020-07-07 ENCOUNTER — Ambulatory Visit: Payer: Medicare Other | Admitting: Certified Registered"

## 2020-07-07 ENCOUNTER — Ambulatory Visit
Admission: RE | Admit: 2020-07-07 | Discharge: 2020-07-07 | Disposition: A | Discharge status: Home / Self Care | Payer: Medicare Other | Attending: Gastroenterology | Admitting: Gastroenterology

## 2020-07-07 DIAGNOSIS — Z1211 Encounter for screening for malignant neoplasm of colon: Secondary | ICD-10-CM | POA: Insufficient documentation

## 2020-07-07 DIAGNOSIS — I10 Essential (primary) hypertension: Secondary | ICD-10-CM | POA: Diagnosis not present

## 2020-07-07 DIAGNOSIS — Z79899 Other long term (current) drug therapy: Secondary | ICD-10-CM | POA: Diagnosis not present

## 2020-07-07 DIAGNOSIS — D125 Benign neoplasm of sigmoid colon: Secondary | ICD-10-CM | POA: Insufficient documentation

## 2020-07-07 DIAGNOSIS — Z791 Long term (current) use of non-steroidal anti-inflammatories (NSAID): Secondary | ICD-10-CM | POA: Diagnosis not present

## 2020-07-07 DIAGNOSIS — Z8521 Personal history of malignant neoplasm of larynx: Secondary | ICD-10-CM | POA: Diagnosis not present

## 2020-07-07 DIAGNOSIS — Z86010 Personal history of colonic polyps: Secondary | ICD-10-CM | POA: Diagnosis not present

## 2020-07-07 DIAGNOSIS — K635 Polyp of colon: Secondary | ICD-10-CM | POA: Insufficient documentation

## 2020-07-07 DIAGNOSIS — D122 Benign neoplasm of ascending colon: Secondary | ICD-10-CM | POA: Diagnosis not present

## 2020-07-07 DIAGNOSIS — Z7982 Long term (current) use of aspirin: Secondary | ICD-10-CM | POA: Diagnosis not present

## 2020-07-07 DIAGNOSIS — K640 First degree hemorrhoids: Secondary | ICD-10-CM | POA: Insufficient documentation

## 2020-07-07 HISTORY — DX: Benign neoplasm of colon, unspecified: D12.6

## 2020-07-07 HISTORY — DX: Unspecified osteoarthritis, unspecified site: M19.90

## 2020-07-07 HISTORY — PX: COLONOSCOPY WITH PROPOFOL: SHX5780

## 2020-07-07 HISTORY — DX: Hyperlipidemia, unspecified: E78.5

## 2020-07-07 HISTORY — DX: Malignant (primary) neoplasm, unspecified: C80.1

## 2020-07-07 HISTORY — DX: Calculus of kidney: N20.0

## 2020-07-07 SURGERY — COLONOSCOPY WITH PROPOFOL
Anesthesia: General

## 2020-07-07 MED ORDER — PROPOFOL 10 MG/ML IV BOLUS
INTRAVENOUS | Status: DC | PRN
  Administered 2020-07-07: 80 mg via INTRAVENOUS
  Administered 2020-07-07: 20 mg via INTRAVENOUS

## 2020-07-07 MED ORDER — PROPOFOL 500 MG/50ML IV EMUL
INTRAVENOUS | Status: DC | PRN
  Administered 2020-07-07: 125 ug/kg/min via INTRAVENOUS

## 2020-07-07 MED ORDER — LIDOCAINE HCL (CARDIAC) PF 100 MG/5ML IV SOSY
PREFILLED_SYRINGE | INTRAVENOUS | Status: DC | PRN
  Administered 2020-07-07: 50 mg via INTRAVENOUS

## 2020-07-07 MED ORDER — PROPOFOL 500 MG/50ML IV EMUL
INTRAVENOUS | Status: AC
  Filled 2020-07-07: qty 50

## 2020-07-07 MED ORDER — SODIUM CHLORIDE 0.9 % IV SOLN
INTRAVENOUS | Status: DC
  Administered 2020-07-07: 20 mL/h via INTRAVENOUS

## 2020-07-07 NOTE — Anesthesia Preprocedure Evaluation (Signed)
Anesthesia Evaluation  Patient identified by MRN, date of birth, ID band Patient awake    Reviewed: Allergy & Precautions, H&P , NPO status , reviewed documented beta blocker date and time   Airway Mallampati: II  TM Distance: >3 FB Neck ROM: limited    Dental  (+) Caps, Teeth Intact   Pulmonary sleep apnea and Continuous Positive Airway Pressure Ventilation , former smoker,  Education re OSA/TIVA done   Pulmonary exam normal        Cardiovascular Normal cardiovascular exam     Neuro/Psych    GI/Hepatic neg GERD  ,  Endo/Other    Renal/GU Renal disease     Musculoskeletal  (+) Arthritis ,   Abdominal   Peds  Hematology   Anesthesia Other Findings Past Medical History: No date: Arthritis     Comment:  osteoarthritis of right knee No date: Cancer (HCC)     Comment:  Laryngeal cancer No date: Hemoptysis No date: Hypercholesterolemia No date: Hyperlipidemia No date: Kidney stones No date: Nephrolithiasis No date: Sleep apnea     Comment:  CPAP No date: Tubular adenoma of colon  Past Surgical History: No date: COLONOSCOPY 01/01/2015: COLONOSCOPY WITH PROPOFOL; N/A     Comment:  Procedure: COLONOSCOPY WITH PROPOFOL;  Surgeon: Hulen Luster, MD;  Location: [facility] ENDOSCOPY;  Service:               Gastroenterology;  Laterality: N/A; 03/20/2020: MICROLARYNGOSCOPY; N/A     Comment:  Procedure: MICROLARYNGOSCOPY cwith excision of left               vocal cord lesion;  Surgeon: Beverly Gust, MD;                Location: [facility];  Service: ENT;                Laterality: N/A;  sleep apnea 08/2014: PROSTATECTOMY     Comment:  laparoscopic robotic  BMI    Body Mass Index: 35.73 kg/m      Reproductive/Obstetrics                             Anesthesia Physical Anesthesia Plan  ASA: II  Anesthesia Plan: General   Post-op Pain Management:    Induction:  Intravenous  PONV Risk Score and Plan: Treatment may vary due to age or medical condition and TIVA  Airway Management Planned: Nasal Cannula and Natural Airway  Additional Equipment:   Intra-op Plan:   Post-operative Plan:   Informed Consent: I have reviewed the patients History and Physical, chart, labs and discussed the procedure including the risks, benefits and alternatives for the proposed anesthesia with the patient or authorized representative who has indicated his/her understanding and acceptance.     Dental Advisory Given  Plan Discussed with: CRNA  Anesthesia Plan Comments:         Anesthesia Quick Evaluation

## 2020-07-07 NOTE — H&P (Signed)
Outpatient short stay form Pre-procedure 07/07/2020 9:33 AM Raylene Miyamoto MD, MPH  Primary Physician: Dr. Ouida Sills  Reason for visit:  Surveillance colonoscopy  History of present illness:   70 y/o gentleman with history of laryngeal cancer, hypertension, and polyp history here for surveillance colonoscopy. No blood thinners. No abdominal surgeries. No first degree relatives with history of colon cancer.    Current Facility-Administered Medications:  .  0.9 %  sodium chloride infusion, , Intravenous, Continuous, Natilee Gauer, Hilton Cork, MD, Last Rate: 20 mL/hr at 07/07/20 0930, 20 mL/hr at 07/07/20 0930  Medications Prior to Admission  Medication Sig Dispense Refill Last Dose  . Cholecalciferol (VITAMIN D3) 10 MCG (400 UNIT) tablet Take 400 Units by mouth daily.   Past Week at Unknown time  . Flaxseed, Linseed, (FLAXSEED OIL PO) Take by mouth daily.   Past Week at Unknown time  . hydrochlorothiazide (HYDRODIURIL) 25 MG tablet Take 25 mg by mouth daily.   07/06/2020 at Unknown time  . meloxicam (MOBIC) 7.5 MG tablet Take 7.5 mg by mouth daily as needed for pain.   Past Week at Unknown time  . POTASSIUM CITRATE PO Take 10 mEq by mouth daily.   07/06/2020 at Unknown time  . simvastatin (ZOCOR) 40 MG tablet Take 40 mg by mouth daily.   07/06/2020 at Unknown time  . ASPIRIN 81 PO Take 81 mg by mouth daily. Alternates 81 mg and 162 mg (Patient not taking: Reported on 07/07/2020)   Not Taking at Unknown time     No Known Allergies   Past Medical History:  Diagnosis Date  . Arthritis    osteoarthritis of right knee  . Cancer [facility])    Laryngeal cancer  . Hemoptysis   . Hypercholesterolemia   . Hyperlipidemia   . Kidney stones   . Nephrolithiasis   . Sleep apnea    CPAP  . Tubular adenoma of colon     Review of systems:  Otherwise negative.    Physical Exam  Gen: Alert, oriented. Appears stated age.  HEENT: PERRLA. Lungs: No respiratory distress CV: RRR Abd: soft, benign, no  masses Ext: No edema    Planned procedures: Proceed with colonoscopy. The patient understands the nature of the planned procedure, indications, risks, alternatives and potential complications including but not limited to bleeding, infection, perforation, damage to internal organs and possible oversedation/side effects from anesthesia. The patient agrees and gives consent to proceed.  Please refer to procedure notes for findings, recommendations and patient disposition/instructions.     Raylene Miyamoto MD, MPH Gastroenterology 07/07/2020  9:33 AM

## 2020-07-07 NOTE — Op Note (Signed)
[facility] Patient Name: William Larsen Procedure Date: 07/07/2020 9:40 AM MRN: [id number] Account #: [id number] Date of Birth: Apr 12, 1951 Admit Type: Outpatient Age: 70 Room: [facility] ENDO ROOM 1 Gender: Male Note Status: Finalized Procedure:             Colonoscopy Indications:           High risk colon cancer surveillance: Personal history                         of non-advanced adenoma Providers:             Andrey Farmer MD, MD Referring MD:          Ocie Cornfield. Ouida Sills MD, MD (Referring MD) Medicines:             Monitored Anesthesia Care Complications:         No immediate complications. Estimated blood loss:                         Minimal. Procedure:             Pre-Anesthesia Assessment:                        - Prior to the procedure, a History and Physical was                         performed, and patient medications and allergies were                         reviewed. The patient is competent. The risks and                         benefits of the procedure and the sedation options and                         risks were discussed with the patient. All questions                         were answered and informed consent was obtained.                         Patient identification and proposed procedure were                         verified by the physician, the nurse, the anesthetist                         and the technician in the endoscopy suite. Mental                         Status Examination: alert and oriented. Airway                         Examination: normal oropharyngeal airway and neck                         mobility. Respiratory Examination: clear to  auscultation. CV Examination: normal. Prophylactic                         Antibiotics: The patient does not require prophylactic                         antibiotics. Prior Anticoagulants: The patient has                         taken no previous  anticoagulant or antiplatelet                         agents. ASA Grade Assessment: II - A patient with mild                         systemic disease. After reviewing the risks and                         benefits, the patient was deemed in satisfactory                         condition to undergo the procedure. The anesthesia                         plan was to use monitored anesthesia care (MAC).                         Immediately prior to administration of medications,                         the patient was re-assessed for adequacy to receive                         sedatives. The heart rate, respiratory rate, oxygen                         saturations, blood pressure, adequacy of pulmonary                         ventilation, and response to care were monitored                         throughout the procedure. The physical status of the                         patient was re-assessed after the procedure.                        After obtaining informed consent, the colonoscope was                         passed under direct vision. Throughout the procedure,                         the patient's blood pressure, pulse, and oxygen                         saturations were monitored continuously. The  Colonoscope was introduced through the anus and                         advanced to the the cecum, identified by appendiceal                         orifice and ileocecal valve. The colonoscopy was                         performed without difficulty. The patient tolerated                         the procedure well. The quality of the bowel                         preparation was excellent. Findings:      The perianal and digital rectal examinations were normal.      A 4 mm polyp was found in the ascending colon. The polyp was sessile.       The polyp was removed with a cold snare. Resection and retrieval were       complete. Estimated blood loss was minimal.      A 3  mm polyp was found in the proximal transverse colon. The polyp was       sessile. The polyp was removed with a cold snare. Resection and       retrieval were complete. Estimated blood loss was minimal.      A less than 1 mm polyp was found in the descending colon. The polyp was       sessile. The polyp was removed with a jumbo cold forceps. Resection and       retrieval were complete. Estimated blood loss was minimal.      A 2 mm polyp was found in the sigmoid colon. The polyp was sessile. The       polyp was removed with a cold snare. Resection and retrieval were       complete. Estimated blood loss was minimal.      Internal hemorrhoids were found during retroflexion. The hemorrhoids       were Grade I (internal hemorrhoids that do not prolapse).      The exam was otherwise without abnormality on direct and retroflexion       views. Impression:            - One 4 mm polyp in the ascending colon, removed with                         a cold snare. Resected and retrieved.                        - One 3 mm polyp in the proximal transverse colon,                         removed with a cold snare. Resected and retrieved.                        - One less than 1 mm polyp in the descending colon,  removed with a jumbo cold forceps. Resected and                         retrieved.                        - One 2 mm polyp in the sigmoid colon, removed with a                         cold snare. Resected and retrieved.                        - Internal hemorrhoids.                        - The examination was otherwise normal on direct and                         retroflexion views. Recommendation:        - Discharge patient to home.                        - Resume previous diet.                        - Continue present medications.                        - Await pathology results.                        - Repeat colonoscopy date to be determined after                          pending pathology results are reviewed for                         surveillance based on pathology results.                        - Return to referring physician as previously                         scheduled. Procedure Code(s):     --- Professional ---                        [phone], Colonoscopy, flexible; with removal of                         tumor(s), polyp(s), or other lesion(s) by snare                         technique                        [id number], 65, Colonoscopy, flexible; with biopsy, single                         or multiple Diagnosis Code(s):     --- Professional ---  Z86.010, Personal history of colonic polyps                        K63.5, Polyp of colon                        K64.0, First degree hemorrhoids CPT copyright 2019 American Medical Association. All rights reserved. The codes documented in this report are preliminary and upon coder review may  be revised to meet current compliance requirements. Andrey Farmer MD, MD 07/07/2020 10:11:28 AM Number of Addenda: 0 Note Initiated On: 07/07/2020 9:40 AM Scope Withdrawal Time: 0 hours 16 minutes 53 seconds  Total Procedure Duration: 0 hours 19 minutes 33 seconds  Estimated Blood Loss:  Estimated blood loss was minimal.      [facility]

## 2020-07-07 NOTE — Interval H&P Note (Signed)
History and Physical Interval Note:  07/07/2020 9:36 AM  William Larsen.  has presented today for surgery, with the diagnosis of [place].  The various methods of treatment have been discussed with the patient and family. After consideration of risks, benefits and other options for treatment, the patient has consented to  Procedure(s): COLONOSCOPY WITH PROPOFOL (N/A) as a surgical intervention.  The patient's history has been reviewed, patient examined, no change in status, stable for surgery.  I have reviewed the patient's chart and labs.  Questions were answered to the patient's satisfaction.     Lesly Rubenstein  Ok to proceed with colonoscopy

## 2020-07-07 NOTE — Anesthesia Postprocedure Evaluation (Signed)
Anesthesia Post Note  Patient: William Larsen.  Procedure(s) Performed: COLONOSCOPY WITH PROPOFOL (N/A )  Patient location during evaluation: Endoscopy Anesthesia Type: General Level of consciousness: awake and alert Pain management: pain level controlled Vital Signs Assessment: post-procedure vital signs reviewed and stable Respiratory status: spontaneous breathing, nonlabored ventilation and respiratory function stable Cardiovascular status: blood pressure returned to baseline and stable Postop Assessment: no apparent nausea or vomiting Anesthetic complications: no   No complications documented.   Last Vitals:  Vitals:   07/07/20 1032 07/07/20 1042  BP: 109/71 112/67  Pulse: 71 68  Resp: 13 12  Temp:    SpO2: 95% 95%    Last Pain:  Vitals:   07/07/20 1042  TempSrc:   PainSc: 0-No pain                 Alphonsus Sias

## 2020-07-07 NOTE — Transfer of Care (Signed)
Immediate Anesthesia Transfer of Care Note  Patient: William Larsen.  Procedure(s) Performed: COLONOSCOPY WITH PROPOFOL (N/A )  Patient Location: PACU  Anesthesia Type:MAC  Level of Consciousness: awake and drowsy  Airway & Oxygen Therapy: Patient Spontanous Breathing  Post-op Assessment: Report given to RN and Post -op Vital signs reviewed and stable  Post vital signs: stable  Last Vitals:  Vitals Value Taken Time  BP 99/57 07/07/20 1012  Temp 36.2 C 07/07/20 1012  Pulse 81 07/07/20 1013  Resp 15 07/07/20 1013  SpO2 94 % 07/07/20 1013  Vitals shown include unvalidated device data.  Last Pain:  Vitals:   07/07/20 1012  TempSrc: Temporal  PainSc: 0-No pain         Complications: No complications documented.

## 2020-07-08 ENCOUNTER — Encounter: Payer: Self-pay | Admitting: Gastroenterology

## 2020-07-08 LAB — SURGICAL PATHOLOGY

## 2024-03-14 ENCOUNTER — Encounter: Payer: Self-pay | Admitting: Internal Medicine

## 2024-03-19 ENCOUNTER — Other Ambulatory Visit: Payer: Self-pay | Admitting: Internal Medicine

## 2024-03-19 ENCOUNTER — Encounter: Payer: Self-pay | Admitting: Internal Medicine

## 2024-03-19 DIAGNOSIS — I714 Abdominal aortic aneurysm, without rupture, unspecified: Secondary | ICD-10-CM

## 2024-03-19 DIAGNOSIS — R109 Unspecified abdominal pain: Secondary | ICD-10-CM

## 2024-03-19 DIAGNOSIS — R10A1 Flank pain, right side: Secondary | ICD-10-CM

## 2024-03-25 ENCOUNTER — Ambulatory Visit
Admission: RE | Admit: 2024-03-25 | Discharge: 2024-03-25 | Disposition: A | Discharge status: Home / Self Care | Source: Ambulatory Visit | Attending: Internal Medicine | Admitting: Internal Medicine

## 2024-03-25 DIAGNOSIS — R109 Unspecified abdominal pain: Secondary | ICD-10-CM

## 2024-03-25 DIAGNOSIS — I714 Abdominal aortic aneurysm, without rupture, unspecified: Secondary | ICD-10-CM

## 2024-03-25 DIAGNOSIS — R10A1 Flank pain, right side: Secondary | ICD-10-CM

## 2024-03-25 MED ORDER — IOPAMIDOL (ISOVUE-300) INJECTION 61%
100.0000 mL | Freq: Once | INTRAVENOUS | Status: AC | PRN
Start: 2024-03-25 — End: 2024-03-25
  Administered 2024-03-25: 100 mL via INTRAVENOUS
